# Patient Record
Sex: Female | Born: 1935 | Race: White | Hispanic: No | Marital: Married
Health system: Southern US, Community
[De-identification: ages and names within clinical notes are randomized; demographics above are authoritative.]

---

## 2003-12-15 ENCOUNTER — Other Ambulatory Visit: Payer: Self-pay

## 2007-01-05 ENCOUNTER — Emergency Department: Payer: Self-pay | Admitting: Emergency Medicine

## 2007-03-20 ENCOUNTER — Other Ambulatory Visit: Payer: Self-pay

## 2007-03-20 ENCOUNTER — Inpatient Hospital Stay: Payer: Self-pay | Admitting: Cardiovascular Disease

## 2007-03-21 ENCOUNTER — Other Ambulatory Visit: Payer: Self-pay

## 2008-02-26 ENCOUNTER — Other Ambulatory Visit: Payer: Self-pay

## 2008-02-26 ENCOUNTER — Ambulatory Visit: Payer: Self-pay | Admitting: Ophthalmology

## 2008-03-09 ENCOUNTER — Ambulatory Visit: Payer: Self-pay | Admitting: Ophthalmology

## 2008-05-30 ENCOUNTER — Emergency Department: Payer: Self-pay | Admitting: Emergency Medicine

## 2008-06-07 ENCOUNTER — Ambulatory Visit: Payer: Self-pay | Admitting: Ophthalmology

## 2010-01-13 ENCOUNTER — Ambulatory Visit: Payer: Self-pay | Admitting: Unknown Physician Specialty

## 2010-07-26 ENCOUNTER — Ambulatory Visit: Payer: Self-pay | Admitting: Cardiovascular Disease

## 2010-09-28 ENCOUNTER — Emergency Department: Payer: Self-pay | Admitting: Emergency Medicine

## 2010-11-08 ENCOUNTER — Ambulatory Visit: Payer: Self-pay | Admitting: Family Medicine

## 2011-01-19 ENCOUNTER — Ambulatory Visit: Payer: Self-pay | Admitting: Cardiovascular Disease

## 2011-09-28 ENCOUNTER — Emergency Department: Payer: Self-pay | Admitting: *Deleted

## 2011-09-28 LAB — CBC
HGB: 10.9 g/dL — ABNORMAL LOW (ref 12.0–16.0)
MCHC: 33.7 g/dL (ref 32.0–36.0)
MCV: 100 fL (ref 80–100)
Platelet: 70 10*3/uL — ABNORMAL LOW (ref 150–440)
RBC: 3.24 10*6/uL — ABNORMAL LOW (ref 3.80–5.20)
RDW: 16.2 % — ABNORMAL HIGH (ref 11.5–14.5)
WBC: 4.7 10*3/uL (ref 3.6–11.0)

## 2011-09-28 LAB — COMPREHENSIVE METABOLIC PANEL
Anion Gap: 6 — ABNORMAL LOW (ref 7–16)
BUN: 12 mg/dL (ref 7–18)
Bilirubin,Total: 0.8 mg/dL (ref 0.2–1.0)
Chloride: 105 mmol/L (ref 98–107)
Co2: 27 mmol/L (ref 21–32)
Creatinine: 0.91 mg/dL (ref 0.60–1.30)
EGFR (African American): >60
EGFR (Non-African Amer.): >60
Glucose: 125 mg/dL — ABNORMAL HIGH (ref 65–99)
Potassium: 3.8 mmol/L (ref 3.5–5.1)
SGOT(AST): 20 U/L (ref 15–37)
Total Protein: 6.5 g/dL (ref 6.4–8.2)

## 2011-09-28 LAB — TROPONIN I: Troponin-I: <0.02 ng/mL

## 2011-09-28 LAB — CK TOTAL AND CKMB (NOT AT ARMC): CK-MB: 0.7 ng/mL (ref 0.5–3.6)

## 2013-06-09 ENCOUNTER — Ambulatory Visit: Payer: Self-pay | Admitting: Family Medicine

## 2013-10-02 LAB — URINALYSIS, COMPLETE
Glucose,UR: NEGATIVE mg/dL (ref 0–75)
Nitrite: NEGATIVE
Ph: 6 (ref 4.5–8.0)
Protein: 100
Specific Gravity: 1.035 (ref 1.003–1.030)
Transitional Epi: 4

## 2013-10-02 LAB — SALICYLATE LEVEL

## 2013-10-02 LAB — COMPREHENSIVE METABOLIC PANEL
Albumin: 4.1 g/dL (ref 3.4–5.0)
Alkaline Phosphatase: 85 U/L
Anion Gap: 5 — ABNORMAL LOW (ref 7–16)
BUN: 11 mg/dL (ref 7–18)
Bilirubin,Total: 0.9 mg/dL (ref 0.2–1.0)
CO2: 29 mmol/L (ref 21–32)
CREATININE: 1.01 mg/dL (ref 0.60–1.30)
Calcium, Total: 8.4 mg/dL — ABNORMAL LOW (ref 8.5–10.1)
Chloride: 104 mmol/L (ref 98–107)
EGFR (African American): 58 — ABNORMAL LOW
GFR CALC NON AF AMER: 50 — AB
Glucose: 115 mg/dL — ABNORMAL HIGH (ref 65–99)
Osmolality: 276 (ref 275–301)
Potassium: 3.8 mmol/L (ref 3.5–5.1)
SGOT(AST): 19 U/L (ref 15–37)
SGPT (ALT): 17 U/L (ref 12–78)
Sodium: 138 mmol/L (ref 136–145)
Total Protein: 7.5 g/dL (ref 6.4–8.2)

## 2013-10-02 LAB — DRUG SCREEN, URINE
Amphetamines, Ur Screen: NEGATIVE (ref ?–1000)
BENZODIAZEPINE, UR SCRN: NEGATIVE (ref ?–200)
Barbiturates, Ur Screen: NEGATIVE (ref ?–200)
CANNABINOID 50 NG, UR ~~LOC~~: NEGATIVE (ref ?–50)
Cocaine Metabolite,Ur ~~LOC~~: NEGATIVE (ref ?–300)
MDMA (ECSTASY) UR SCREEN: NEGATIVE (ref ?–500)
Methadone, Ur Screen: NEGATIVE (ref ?–300)
Opiate, Ur Screen: NEGATIVE (ref ?–300)
Phencyclidine (PCP) Ur S: NEGATIVE (ref ?–25)
TRICYCLIC, UR SCREEN: NEGATIVE (ref ?–1000)

## 2013-10-02 LAB — ACETAMINOPHEN LEVEL: Acetaminophen: <2 ug/mL

## 2013-10-02 LAB — CBC
HCT: 39.7 % (ref 35.0–47.0)
HGB: 13.4 g/dL (ref 12.0–16.0)
MCH: 31.6 pg (ref 26.0–34.0)
MCHC: 33.7 g/dL (ref 32.0–36.0)
MCV: 94 fL (ref 80–100)
PLATELETS: 243 10*3/uL (ref 150–440)
RBC: 4.25 10*6/uL (ref 3.80–5.20)
RDW: 16 % — ABNORMAL HIGH (ref 11.5–14.5)
WBC: 15.1 10*3/uL — ABNORMAL HIGH (ref 3.6–11.0)

## 2013-10-02 LAB — ETHANOL
Ethanol %: <0.003 % (ref 0.000–0.080)
Ethanol: <3 mg/dL

## 2013-10-02 LAB — DIGOXIN LEVEL: Digoxin: 0.99 ng/mL

## 2013-10-03 LAB — CBC WITH DIFFERENTIAL/PLATELET
BASOS PCT: 1.5 %
Basophil #: 0.2 10*3/uL — ABNORMAL HIGH (ref 0.0–0.1)
EOS ABS: 0.2 10*3/uL (ref 0.0–0.7)
Eosinophil %: 1.6 %
HCT: 33.2 % — AB (ref 35.0–47.0)
HGB: 11.3 g/dL — AB (ref 12.0–16.0)
LYMPHS ABS: 1 10*3/uL (ref 1.0–3.6)
LYMPHS PCT: 8 %
MCH: 31.8 pg (ref 26.0–34.0)
MCHC: 34.1 g/dL (ref 32.0–36.0)
MCV: 93 fL (ref 80–100)
MONO ABS: 0.8 x10 3/mm (ref 0.2–0.9)
MONOS PCT: 6.5 %
NEUTROS PCT: 82.4 %
Neutrophil #: 10.3 10*3/uL — ABNORMAL HIGH (ref 1.4–6.5)
Platelet: 189 10*3/uL (ref 150–440)
RBC: 3.55 10*6/uL — AB (ref 3.80–5.20)
RDW: 16.2 % — ABNORMAL HIGH (ref 11.5–14.5)
WBC: 12.5 10*3/uL — ABNORMAL HIGH (ref 3.6–11.0)

## 2013-10-04 ENCOUNTER — Inpatient Hospital Stay: Payer: Self-pay | Admitting: Internal Medicine

## 2013-10-04 LAB — URINE CULTURE

## 2013-10-05 LAB — CBC WITH DIFFERENTIAL/PLATELET
Basophil #: 0.1 10*3/uL (ref 0.0–0.1)
Basophil %: 1.1 %
EOS PCT: 1.8 %
Eosinophil #: 0.2 10*3/uL (ref 0.0–0.7)
HCT: 33.1 % — AB (ref 35.0–47.0)
HGB: 11.2 g/dL — ABNORMAL LOW (ref 12.0–16.0)
LYMPHS ABS: 1.4 10*3/uL (ref 1.0–3.6)
Lymphocyte %: 12.1 %
MCH: 31.6 pg (ref 26.0–34.0)
MCHC: 33.7 g/dL (ref 32.0–36.0)
MCV: 94 fL (ref 80–100)
MONO ABS: 0.9 x10 3/mm (ref 0.2–0.9)
MONOS PCT: 7.4 %
NEUTROS ABS: 9.1 10*3/uL — AB (ref 1.4–6.5)
NEUTROS PCT: 77.6 %
PLATELETS: 170 10*3/uL (ref 150–440)
RBC: 3.53 10*6/uL — AB (ref 3.80–5.20)
RDW: 16.5 % — AB (ref 11.5–14.5)
WBC: 11.8 10*3/uL — AB (ref 3.6–11.0)

## 2013-10-05 LAB — BASIC METABOLIC PANEL
ANION GAP: 6 — AB (ref 7–16)
BUN: 9 mg/dL (ref 7–18)
CHLORIDE: 110 mmol/L — AB (ref 98–107)
CREATININE: 0.79 mg/dL (ref 0.60–1.30)
Calcium, Total: 7.9 mg/dL — ABNORMAL LOW (ref 8.5–10.1)
Co2: 27 mmol/L (ref 21–32)
EGFR (African American): >60
EGFR (Non-African Amer.): >60
GLUCOSE: 82 mg/dL (ref 65–99)
OSMOLALITY: 283 (ref 275–301)
Potassium: 3 mmol/L — ABNORMAL LOW (ref 3.5–5.1)
SODIUM: 143 mmol/L (ref 136–145)

## 2013-10-05 LAB — MAGNESIUM: Magnesium: 1.7 mg/dL — ABNORMAL LOW

## 2013-10-06 LAB — POTASSIUM: Potassium: 3 mmol/L — ABNORMAL LOW (ref 3.5–5.1)

## 2014-03-16 ENCOUNTER — Ambulatory Visit: Payer: Self-pay | Admitting: Internal Medicine

## 2014-03-16 LAB — CBC CANCER CENTER
Eosinophil: 1 %
HCT: 35.4 % (ref 35.0–47.0)
HGB: 11.8 g/dL — AB (ref 12.0–16.0)
LYMPHS PCT: 10 %
MCH: 32 pg (ref 26.0–34.0)
MCHC: 33.4 g/dL (ref 32.0–36.0)
MCV: 96 fL (ref 80–100)
Metamyelocyte: 1 %
Monocytes: 6 %
Platelet: 382 x10 3/mm (ref 150–440)
RBC: 3.69 10*6/uL — AB (ref 3.80–5.20)
RDW: 17.6 % — ABNORMAL HIGH (ref 11.5–14.5)
Segmented Neutrophils: 82 %
WBC: 28.4 x10 3/mm — ABNORMAL HIGH (ref 3.6–11.0)

## 2014-03-16 LAB — CREATININE, SERUM
CREATININE: 0.76 mg/dL (ref 0.60–1.30)
EGFR (Non-African Amer.): >60

## 2014-03-16 LAB — HEPATIC FUNCTION PANEL A (ARMC)
ALBUMIN: 3.5 g/dL (ref 3.4–5.0)
ALK PHOS: 104 U/L
ALT: 17 U/L
AST: 12 U/L — AB (ref 15–37)
Bilirubin, Direct: <0.1 mg/dL (ref 0.00–0.20)
Bilirubin,Total: 0.4 mg/dL (ref 0.2–1.0)
Total Protein: 7.4 g/dL (ref 6.4–8.2)

## 2014-04-13 ENCOUNTER — Ambulatory Visit: Payer: Self-pay | Admitting: Internal Medicine

## 2014-05-18 ENCOUNTER — Ambulatory Visit: Payer: Self-pay | Admitting: Internal Medicine

## 2014-05-18 LAB — COMPREHENSIVE METABOLIC PANEL
ALBUMIN: 3.4 g/dL (ref 3.4–5.0)
ALK PHOS: 112 U/L
ALT: 15 U/L
Anion Gap: 7 (ref 7–16)
BILIRUBIN TOTAL: 0.5 mg/dL (ref 0.2–1.0)
BUN: 12 mg/dL (ref 7–18)
Calcium, Total: 8.5 mg/dL (ref 8.5–10.1)
Chloride: 94 mmol/L — ABNORMAL LOW (ref 98–107)
Co2: 25 mmol/L (ref 21–32)
Creatinine: 0.92 mg/dL (ref 0.60–1.30)
EGFR (Non-African Amer.): >60
Glucose: 175 mg/dL — ABNORMAL HIGH (ref 65–99)
Osmolality: 257 (ref 275–301)
Potassium: 4.2 mmol/L (ref 3.5–5.1)
SGOT(AST): 14 U/L — ABNORMAL LOW (ref 15–37)
SODIUM: 126 mmol/L — AB (ref 136–145)
Total Protein: 6.5 g/dL (ref 6.4–8.2)

## 2014-05-18 LAB — CBC CANCER CENTER
BASOS PCT: 0.5 %
Basophil #: 0.2 x10 3/mm — ABNORMAL HIGH (ref 0.0–0.1)
EOS PCT: 1.4 %
Eosinophil #: 0.6 x10 3/mm (ref 0.0–0.7)
HCT: 36.4 % (ref 35.0–47.0)
HGB: 12.1 g/dL (ref 12.0–16.0)
LYMPHS ABS: 2.1 x10 3/mm (ref 1.0–3.6)
LYMPHS PCT: 4.7 %
MCH: 31.3 pg (ref 26.0–34.0)
MCHC: 33.2 g/dL (ref 32.0–36.0)
MCV: 94 fL (ref 80–100)
MONO ABS: 1.8 x10 3/mm — AB (ref 0.2–0.9)
MONOS PCT: 4.1 %
NEUTROS ABS: 39.1 x10 3/mm — AB (ref 1.4–6.5)
Neutrophil %: 89.3 %
PLATELETS: 704 x10 3/mm — AB (ref 150–440)
RBC: 3.86 10*6/uL (ref 3.80–5.20)
RDW: 17.8 % — ABNORMAL HIGH (ref 11.5–14.5)
WBC: 43.8 x10 3/mm — ABNORMAL HIGH (ref 3.6–11.0)

## 2014-05-18 LAB — PHOSPHORUS: Phosphorus: 2.9 mg/dL (ref 2.5–4.9)

## 2014-05-18 LAB — MAGNESIUM: Magnesium: 1.9 mg/dL

## 2014-05-25 LAB — BASIC METABOLIC PANEL
ANION GAP: 7 (ref 7–16)
BUN: 14 mg/dL (ref 7–18)
Calcium, Total: 8.4 mg/dL — ABNORMAL LOW (ref 8.5–10.1)
Chloride: 96 mmol/L — ABNORMAL LOW (ref 98–107)
Co2: 26 mmol/L (ref 21–32)
Creatinine: 0.99 mg/dL (ref 0.60–1.30)
EGFR (Non-African Amer.): 56 — ABNORMAL LOW
Glucose: 97 mg/dL (ref 65–99)
Osmolality: 259 (ref 275–301)
Potassium: 4.2 mmol/L (ref 3.5–5.1)
Sodium: 129 mmol/L — ABNORMAL LOW (ref 136–145)

## 2014-05-25 LAB — CBC CANCER CENTER
BASOS PCT: 0.9 %
Basophil #: 0.3 x10 3/mm — ABNORMAL HIGH (ref 0.0–0.1)
EOS PCT: 1.7 %
Eosinophil #: 0.6 x10 3/mm (ref 0.0–0.7)
HCT: 33.9 % — ABNORMAL LOW (ref 35.0–47.0)
HGB: 11.2 g/dL — ABNORMAL LOW (ref 12.0–16.0)
Lymphocyte #: 1.9 x10 3/mm (ref 1.0–3.6)
Lymphocyte %: 5.7 %
MCH: 31 pg (ref 26.0–34.0)
MCHC: 33 g/dL (ref 32.0–36.0)
MCV: 94 fL (ref 80–100)
Monocyte #: 1.8 x10 3/mm — ABNORMAL HIGH (ref 0.2–0.9)
Monocyte %: 5.5 %
NEUTROS PCT: 86.2 %
Neutrophil #: 29 x10 3/mm — ABNORMAL HIGH (ref 1.4–6.5)
Platelet: 477 x10 3/mm — ABNORMAL HIGH (ref 150–440)
RBC: 3.62 10*6/uL — ABNORMAL LOW (ref 3.80–5.20)
RDW: 17.6 % — ABNORMAL HIGH (ref 11.5–14.5)
WBC: 33.6 x10 3/mm — ABNORMAL HIGH (ref 3.6–11.0)

## 2014-06-08 LAB — CREATININE, SERUM
Creatinine: 0.97 mg/dL (ref 0.60–1.30)
GFR CALC NON AF AMER: 58 — AB

## 2014-06-08 LAB — CBC CANCER CENTER
BASOS ABS: 0.3 x10 3/mm — AB (ref 0.0–0.1)
BASOS PCT: 0.8 %
EOS ABS: 0.5 x10 3/mm (ref 0.0–0.7)
Eosinophil %: 1.5 %
HCT: 32.5 % — ABNORMAL LOW (ref 35.0–47.0)
HGB: 10.6 g/dL — ABNORMAL LOW (ref 12.0–16.0)
Lymphocyte #: 1.5 x10 3/mm (ref 1.0–3.6)
Lymphocyte %: 4.3 %
MCH: 31.2 pg (ref 26.0–34.0)
MCHC: 32.8 g/dL (ref 32.0–36.0)
MCV: 95 fL (ref 80–100)
MONO ABS: 1.4 x10 3/mm — AB (ref 0.2–0.9)
Monocyte %: 4.2 %
NEUTROS PCT: 89.2 %
Neutrophil #: 30.2 x10 3/mm — ABNORMAL HIGH (ref 1.4–6.5)
Platelet: 506 x10 3/mm — ABNORMAL HIGH (ref 150–440)
RBC: 3.42 10*6/uL — ABNORMAL LOW (ref 3.80–5.20)
RDW: 18 % — ABNORMAL HIGH (ref 11.5–14.5)
WBC: 33.8 x10 3/mm — ABNORMAL HIGH (ref 3.6–11.0)

## 2014-06-08 LAB — POTASSIUM: POTASSIUM: 4.1 mmol/L (ref 3.5–5.1)

## 2014-06-08 LAB — MAGNESIUM: MAGNESIUM: 1.9 mg/dL

## 2014-06-13 ENCOUNTER — Ambulatory Visit: Payer: Self-pay | Admitting: Internal Medicine

## 2014-12-01 ENCOUNTER — Inpatient Hospital Stay
Admit: 2014-12-01 | Disposition: A | Discharge status: Hospice | Payer: Self-pay | Attending: Internal Medicine | Admitting: Internal Medicine

## 2014-12-01 LAB — BASIC METABOLIC PANEL
ANION GAP: 9 (ref 7–16)
BUN: 14 mg/dL
Calcium, Total: 8.4 mg/dL — ABNORMAL LOW
Chloride: 101 mmol/L
Co2: 27 mmol/L
Creatinine: 1.02 mg/dL — ABNORMAL HIGH
EGFR (African American): 57 — ABNORMAL LOW
EGFR (Non-African Amer.): 49 — ABNORMAL LOW
Glucose: 101 mg/dL — ABNORMAL HIGH
Potassium: 3.4 mmol/L — ABNORMAL LOW
SODIUM: 137 mmol/L

## 2014-12-01 LAB — CBC WITH DIFFERENTIAL/PLATELET
Bands: 1 %
COMMENT - H1-COM3: NORMAL
EOS PCT: 2 %
HCT: 40.2 % (ref 35.0–47.0)
HGB: 12.9 g/dL (ref 12.0–16.0)
LYMPHS PCT: 6 %
MCH: 30.4 pg (ref 26.0–34.0)
MCHC: 32 g/dL (ref 32.0–36.0)
MCV: 95 fL (ref 80–100)
METAMYELOCYTE: 1 %
Monocytes: 4 %
Platelet: 266 10*3/uL (ref 150–440)
RBC: 4.24 10*6/uL (ref 3.80–5.20)
RDW: 19.9 % — AB (ref 11.5–14.5)
Segmented Neutrophils: 86 %
WBC: 71.5 10*3/uL — ABNORMAL HIGH (ref 3.6–11.0)

## 2014-12-01 LAB — URINALYSIS, COMPLETE
BACTERIA: NONE SEEN
Bilirubin,UR: NEGATIVE
Glucose,UR: NEGATIVE mg/dL (ref 0–75)
Ketone: NEGATIVE
Leukocyte Esterase: NEGATIVE
Nitrite: NEGATIVE
PROTEIN: NEGATIVE
Ph: 5 (ref 4.5–8.0)
SPECIFIC GRAVITY: 1.006 (ref 1.003–1.030)
WBC UR: NONE SEEN /HPF (ref 0–5)

## 2014-12-01 LAB — TROPONIN I: Troponin-I: 0.2 ng/mL — ABNORMAL HIGH

## 2014-12-02 LAB — TROPONIN I
TROPONIN-I: 0.24 ng/mL — AB
Troponin-I: 0.19 ng/mL — ABNORMAL HIGH

## 2014-12-02 LAB — CK-MB
CK-MB: 2.5 ng/mL
CK-MB: 1.8 ng/mL

## 2014-12-04 LAB — PLATELET COUNT: Platelet: 228 10*3/uL (ref 150–440)

## 2014-12-04 NOTE — H&P (Signed)
PATIENT NAME:  Melissa Hughes, Melissa Hughes MR#:  [id number] DATE OF BIRTH:  Dec 28, 1924  DATE OF ADMISSION:  10/02/2013  PRIMARY CARE PHYSICIAN: Dr. Dossie Arbour.   CHIEF COMPLAINT: Hallucinations.   This is an 81 year old female with a history panic attacks, possible heart disease, who presents with the above complaint. Over the past 2 weeks according to the son, the patient has had increasing hallucinations and confusion, which is unusual for the patient. She does have some baseline confusion as far as time and date, but not the hallucinations. She has been wandering around for the past 2 weeks as well. Son brought her here for further evaluation. In the ER, she was noted to have a urinary tract infection. She was started on antibiotics.   REVIEW OF SYSTEMS:    CONSTITUTIONAL: No fever, fatigue, weakness.  EYES: No blurred or double vision. EARS, NOSE, THROAT: No ear pain, hearing loss, snoring. RESPIRATORY: No cough, wheezing, hemoptysis, COPD. CARDIOVASCULAR: No chest pain, orthopnea, edema, arrhythmia, dyspnea on exertion, palpitations, syncope. GASTROINTESTINAL: No nausea, vomiting, diarrhea, abdominal pain, melena or ulcers. GENITOURINARY: No dysuria or hematuria. ENDOCRINE: No polyuria or polydipsia.  HEMATOLOGIC AND LYMPHATIC: No anemia or easy bruising.  SKIN: No rash or lesions.  MUSCULOSKELETAL: No limited activity. No falls. NEUROLOGIC: No history of CVA or TIA. PSYCHIATRIC: Positive anxiety, panic attacks.   PAST MEDICAL HISTORY:  1.  GERD. 2.  Panic attacks.  3.  Possible heart disease.  4.  Hypertension.  MEDICATIONS: 1. Plavix 75 mg daily. 2.  Metoprolol 200 mg daily.  3.  Losartan 100 mg daily. 4.  Digoxin 0.125 mg daily. 5.  Xanax 0.5 mg t.i.d.   ALLERGIES: MERTHIOLATE.   PAST SURGICAL HISTORY: None.   SOCIAL HISTORY: No tobacco, alcohol, or drug use. She lives by herself. Her family is looking into [facility] independent/assisted living.   PHYSICAL EXAMINATION: VITAL  SIGNS: Temperature 97.8, pulse 77, respirations 18, blood pressure 149/71, 97% on room air.  GENERAL: The patient is alert, oriented to name and place. She is just not oriented to date. She does not appear to be confused. However, after speaking with the nurse, the nurse does state that she has been having some hallucinations. This is obviously not demonstrated during my examination.  HEENT: Head is atraumatic. Pupils are round and reactive. Sclerae anicteric. Mucous membranes are moist. Oropharynx is clear.  NECK: Supple without JVD, carotid bruit, or enlarged thyroid.  CARDIOVASCULAR: Regular rate and rhythm. No murmurs, gallops, or rubs. PMI is not displaced.  LUNGS: Clear to auscultation without crackles, rales, rhonchi, or wheezing. Normal to percussion.  ABDOMEN: Bowel sounds are positive. Nontender, nondistended. No hepatosplenomegaly. EXTREMITIES: No clubbing, cyanosis, or edema.  NEUROLOGIC: Cranial nerves II through XII are intact. No focal deficits. SKIN: Without rashes or lesions. NEUROLOGIC: Cranial nerves II through XII are grossly intact. There are no focal deficits.  LABORATORY DATA: Urine toxicology is negative. Urinalysis shows 3+ LCE with 1484 white blood cells.   Sodium 133, potassium 3.8, chloride 104, bicarbonate 29, BUN 11, creatinine 1.01, glucose 115. White blood cells 15, hemoglobin 13.4, hematocrit 40; platelets are 243. Alcohol less than 3. Tylenol level less than 2. Aspirin level less than 1.7. Digoxin 0.99.   ASSESSMENT AND PLAN: An 81 year old female who presents with hallucinations, found to have a   urinary tract infection. 1.  Hallucinations, likely secondary to urinary tract infection.  2.  Urinary tract infection. Will treat with Rocephin. Urine culture has been ordered. Continue to monitor her symptoms.  3.  Panic attacks. The patient will continue on her outpatient medications.  4.  Hypertension. The patient will continue her losartan.  5.  Heart disease. The  patient will continue on digoxin and Plavix. 6.  The patient is a full code status. This was confirmed by her son.   TIME SPENT: Approximately 40 minutes.   ____________________________ Janyth ContesSital P. Juliene PinaMody, MD spm:jcm D: 10/02/2013 15:44:04 ET T: 10/02/2013 16:53:48 ET JOB#: [id number]  cc: Mercadez Heitman P. Juliene PinaMody, MD, <Dictator> Steele SizerMark A. Crissman, MD Janyth ContesSITAL P Hanh Kertesz MD ELECTRONICALLY SIGNED 10/02/2013 17:48

## 2014-12-04 NOTE — Discharge Summary (Signed)
PATIENT NAME:  Melissa Hughes, Melissa Hughes MR#:  [id number] DATE OF BIRTH:  07-18-1925  DATE OF ADMISSION:  10/04/2013 DATE OF DISCHARGE:  10/06/2013  DISCHARGE DIAGNOSES: 1.  Acute metabolic encephalopathy, now resolved and back to baseline likely due to urinary tract infection with underlying dementia.  2.  Escherichia coli urinary tract infection, improving. 3.  Hypertension, uncontrolled and now improving. 4.  Hypokalemia, repleted and resolved.   SECONDARY DIAGNOSES: 1.  Gastroesophageal reflux disease.  2.  Panic attacks.  3.  Hypertension.   CONSULTATION: Physical therapy.   PROCEDURES AND RADIOLOGY: CT scan of the head without contrast on the 21st of February showed no acute abnormality.   MAJOR LABORATORY PANEL: UA on admission showed 2+ bacteria, 1484 WBCs, 3+ leukocyte esterase, WBC in clumps present.  Urine culture grew more than 100,000 colonies of Escherichia coli.  Serum Tylenol level than 2. Salicylate level less than 1.7.   HISTORY AND SHORT HOSPITAL COURSE: The patient is an 81 year old female with above-mentioned medical problems was admitted for hallucinations. Please see Dr. Camillia HerterMody's dictated history and physical for further details. This was thought to be secondary to UTI. The patient was growing Escherichia coli in the urine which was treated with antibiotic. Her oral metabolic encephalopathy was close to baseline on the 24th of February with treatment of UTI. The patient was also found to have low potassium, which was repleted and resolved, and blood pressure was running high which was also improving with adjustment in the medication. On the 24th of February, she was back to her baseline mental status and was not having any hallucinations. [facility].   PHYSICAL EXAMINATION: VITAL SIGNS: On the date of discharge, her vital signs were as follows: Temperature 98, heart rate 69 per minute, respirations 18 per minute, blood pressure 140/69 mmHg.  She was saturating 95% on room air.   PERTINENT PHYSICAL EXAMINATION ON THE DATE OF DISCHARGE:  CARDIOVASCULAR: S1, S2 normal. No murmurs, rubs or gallops.  LUNGS: Clear to auscultation bilaterally. No wheezing, rales, rhonchi or crepitation.  ABDOMEN: Soft, benign.  NEUROLOGIC: Nonfocal examination.  All other physical examination remained at the baseline.   DISCHARGE MEDICATIONS: 1.  Alprazolam 0.5 mg p.o. b.i.Hughes. 2.  Digoxin 125 mcg p.o. daily.  3.  Metoprolol 200 mg p.o. daily.  4.  Plavix 75 mg p.o. daily.  5.  Losartan 100 mg p.o. daily.  6.  Isosorbide mononitrate 60 mg p.o. daily.  7.  Levaquin 250 mg p.o. daily for 2 more days.   DISCHARGE DIET: Low sodium.  DISCHARGE ACTIVITY: As tolerated.  DISCHARGE INSTRUCTIONS AND FOLLOWUP: The patient was instructed to follow up with her primary care physician, Dr. Vonita MossMark Crissman, in 1 to 2 weeks. She will get physical at home by home health services.   TOTAL TIME DISCHARGING THIS PATIENT: 45 minutes.   ____________________________ Ellamae SiaVipul S. Sherryll BurgerShah, MD vss:ce Hughes: 10/10/2013 16:41:27 ET T: 10/10/2013 18:01:36 ET JOB#: [id number]  cc: Terie Lear S. Sherryll BurgerShah, MD, <Dictator> Steele SizerMark A. Crissman, MD [facility] Ellamae SiaVIPUL S [facility] MD ELECTRONICALLY SIGNED 10/12/2013 13:03

## 2014-12-05 LAB — BASIC METABOLIC PANEL
ANION GAP: 2 — AB (ref 7–16)
BUN: 10 mg/dL
CALCIUM: 7.7 mg/dL — AB
Chloride: 106 mmol/L
Co2: 32 mmol/L
Creatinine: 0.66 mg/dL
EGFR (African American): >60
EGFR (Non-African Amer.): >60
GLUCOSE: 72 mg/dL
POTASSIUM: 2.9 mmol/L — AB
Sodium: 140 mmol/L

## 2014-12-05 LAB — CBC WITH DIFFERENTIAL/PLATELET
Basophil #: 0.2 10*3/uL — ABNORMAL HIGH (ref 0.0–0.1)
Basophil %: 0.4 %
EOS ABS: 0.5 10*3/uL (ref 0.0–0.7)
Eosinophil %: 1.1 %
HCT: 35 % (ref 35.0–47.0)
HGB: 11 g/dL — AB (ref 12.0–16.0)
LYMPHS ABS: 1.9 10*3/uL (ref 1.0–3.6)
Lymphocyte %: 4.7 %
MCH: 30.1 pg (ref 26.0–34.0)
MCHC: 31.5 g/dL — AB (ref 32.0–36.0)
MCV: 95 fL (ref 80–100)
MONO ABS: 1.5 x10 3/mm — AB (ref 0.2–0.9)
Monocyte %: 3.7 %
NEUTROS ABS: 36.9 10*3/uL — AB (ref 1.4–6.5)
NEUTROS PCT: 90.1 %
Platelet: 208 10*3/uL (ref 150–440)
RBC: 3.67 10*6/uL — ABNORMAL LOW (ref 3.80–5.20)
RDW: 19.7 % — ABNORMAL HIGH (ref 11.5–14.5)
WBC: 41 10*3/uL — AB (ref 3.6–11.0)

## 2014-12-06 LAB — CBC WITH DIFFERENTIAL/PLATELET
BASOS ABS: 0.3 10*3/uL — AB (ref 0.0–0.1)
Basophil %: 0.6 %
EOS PCT: 1.3 %
Eosinophil #: 0.6 10*3/uL (ref 0.0–0.7)
HCT: 36.5 % (ref 35.0–47.0)
HGB: 11.7 g/dL — ABNORMAL LOW (ref 12.0–16.0)
Lymphocyte #: 1.8 10*3/uL (ref 1.0–3.6)
Lymphocyte %: 3.9 %
MCH: 30.9 pg (ref 26.0–34.0)
MCHC: 32.2 g/dL (ref 32.0–36.0)
MCV: 96 fL (ref 80–100)
MONOS PCT: 2.9 %
Monocyte #: 1.3 x10 3/mm — ABNORMAL HIGH (ref 0.2–0.9)
NEUTROS ABS: 41.7 10*3/uL — AB (ref 1.4–6.5)
Neutrophil %: 91.3 %
Platelet: 250 10*3/uL (ref 150–440)
RBC: 3.79 10*6/uL — AB (ref 3.80–5.20)
RDW: 19.8 % — ABNORMAL HIGH (ref 11.5–14.5)
WBC: 45.7 10*3/uL — AB (ref 3.6–11.0)

## 2014-12-06 LAB — BASIC METABOLIC PANEL
Anion Gap: 4 — ABNORMAL LOW (ref 7–16)
BUN: 9 mg/dL
CHLORIDE: 105 mmol/L
CREATININE: 0.68 mg/dL
Calcium, Total: 8.1 mg/dL — ABNORMAL LOW
Co2: 31 mmol/L
EGFR (African American): >60
Glucose: 71 mg/dL
Potassium: 4.7 mmol/L
SODIUM: 140 mmol/L

## 2014-12-12 NOTE — H&P (Signed)
PATIENT NAME:  Melissa Hughes, Melissa Hughes MR#:  [id number] DATE OF BIRTH:  1925/07/08  DATE OF ADMISSION:  12/01/2014  PRIMARY CARE PHYSICIAN:  Dr. Vonita MossMark Crissman.   CHIEF COMPLAINT:  Agitation and not her usual self.   HISTORY OF PRESENT ILLNESS:  History is obtained from the ER physician, the patient's son-in-law, and old records.  [name] is an 81 year old Caucasian female with past medical history of chronic myelogenous leukemia, who is off chemotherapy since last year and is [facility], comes to the Emergency Room after son started noticing the patient has been confused for past 1 week and since yesterday has been agitated, shouting, screaming, and not her usual self. She has not eaten in last couple days and has been refusing to take anything orally. In the Emergency Room she is hemodynamically stable, however initially she had normal sinus rhythm, became very tachycardic during my evaluation with heart rate in the 130s. It was felt clinically she was dehydrated from not eating and drinking well. Her urine looks clear and chest x-ray no pneumonia. She is being admitted for acute delirium likely secondary to dehydration. No other etiology is known as of yet.   PAST MEDICAL HISTORY:  1.  Chronic myelogenous leukemia, was on chemotherapy, discontinued since last year since the patient continued to show poor response to chemotherapy.  2.  Hypertension.  3.  Osteoporosis.  4.  CAD.   5.  History of SVT.   6.  History of CHF with fluid retention.   FAMILY HISTORY: From old records, daughter with history of breast cancer.   SOCIAL HISTORY: [facility]. Nonsmoker, nonalcoholic given, and she uses a walker to walk. [facility].   ALLERGIES: TO MERTHIOLATE.    MEDICATIONS:   1. Alprazolam 0.5 mg b.i.Hughes.  2. Aspirin 81 mg daily.  3. Carvedilol 12.5 mg half a tablet b.i.Hughes.  4. Digoxin 125 p.o. daily.  5. Entresto 49/51 half a tablet  b.i.Hughes.  6. Furosemide 20 mg p.o. daily.  7. Omeprazole 40 mg daily.   REVIEW OF SYSTEMS: Unobtainable.   CODE STATUS: No code, DNR.  This is confirmed with the patient's son-in-law.   PHYSICAL EXAMINATION:  GENERAL: She is lethargic. She awakens upon verbal commands, says her name, the rest she is disoriented to time, place, and person.  VITAL SIGNS:  She is afebrile. Pulse is 138, tachycardic, blood pressure is 83/58, saturations are 94% on room air.  HEENT: Atraumatic, normocephalic. Pupils PERRLA.  EOMI intact. Oral mucosa is dry. NECK: Supple. No JVD. No carotid bruit.  RESPIRATORY: Decreased breath sounds at the bases. No respiratory distress or labored breathing.  CARDIOVASCULAR: Tachycardia present. No murmur heard. PMI not lateralized. Chest nontender.   EXTREMITIES:  Dependent pitting edema 3 + both lower extremities.  SKIN: Warm and dry.   NEUROLOGIC:  Unable to assess secondary to patient's lethargy.  PSYCHIATRIC: Unable to assess, the patient is lethargic.   LABORATORY DATA:   1.  UA negative for UTI.  2.  CT head, no acute intracranial abnormality, chronic atrophy, small vessel ischemic disease. 3.  White count is 71.5. Glucose is 101, BUN is 14, creatinine is 1.02, sodium 137, potassium 3.4, chloride is 101, bicarbonate is 27. Troponin is 0.20.   ASSESSMENT: An 81 year old, [place] Hughes, with history of chronic myelogenous leukemia who is status post chemotherapy, now off chemotherapy since last year October of 2015 since not responsive to treatment, history of  coronary artery disease, hypertension, comes into the Emergency Room with:   1.  Altered mental status/acute encephalopathy/acute delirium with agitation, screaming at [facility]. She appears to be clinically dry, has not eaten or drank in the last 3 days. The patient became tachycardic with hypotension, blood pressure in the systolic in the 80s in the Emergency Room. She is currently getting bolus IV fluid. We will  monitor Is and Os. CT head is essentially negative. We will continue to monitor her mental status, keep her n.p.o. at present.  2.  Acute renal failure secondary to dehydration. Continue IV fluids, monitor Is and Os, avoid nephrotoxins.  3.  Hypotension, secondary suspected due to dehydration.  4.  Tachycardia, suspected again due to dehydration. Monitor her blood pressure. Once blood pressure is stable with IV fluids then resume her p.o. blood pressure medications.  5.  History of chronic myelogenous leukemia off chemotherapy since not responding to treatment. The patient is under care of hospice at [facility].  6.  Code status: No code, DNR.  This was discussed with the patient's son-in-law. The patient's son-in-law understands the poor prognosis. He is agreeable to transition to hospice facility in case the patient does not show signs of improvement.  7.  Deep vein thrombosis prophylaxis. Subcutaneous heparin t.i.Hughes.   TIME SPENT: 50 minutes.     ____________________________ Wylie Hail Allena Katz, MD sap:bu Hughes: 12/01/2014 15:34:32 ET T: 12/01/2014 15:47:24 ET JOB#: [id number]  cc: Ryla Cauthon A. Allena Katz, MD, <Dictator> Steele Sizer, MD Willow Ora MD ELECTRONICALLY SIGNED 12/10/2014 15:21

## 2014-12-12 DEATH — deceased

## 2014-12-13 NOTE — Discharge Summary (Signed)
PATIENT NAME:  Melissa Hughes, Melissa Hughes MR#:  [id number] DATE OF BIRTH:  1924/09/02  DATE OF ADMISSION:  12/01/2014 DATE OF DISCHARGE:  12/06/2014  PRESENTING COMPLAINT: Altered mental status.   DISCHARGE DIAGNOSES: 1. Acute metabolic encephalopathy.  2. Dementia.  3. Hospital-acquired pneumonia.  4. Hypertension.  5. Hypokalemia.  6. Coronary artery disease.  7. Chronic myelogenous leukemia.     CONSULTATIONS: None.   PROCEDURES: 1. CT scan of the head without contrast shows no acute intracranial abnormality,  chronic atrophy and slight small vessel ischemic disease.  2. Chest x-ray April 20 shows low lung volumes, prominent heart size, atherosclerotic calcification of the aorta, hiatal hernia, bibasilar atelectasis, and central bronchitic changes. No gross infiltrate or pleural effusion.  3. Chest x-ray April 21 shows increasing bibasilar atelectasis versus infiltrate.  4. A 2D echocardiogram April 21 shows left ventricular ejection fraction 60-65%. Normal left ventricular systolic function. Mild mitral valve regurgitation, mild to moderate aortic valve sclerosis/calcification without any evidence of aortic stenosis. Moderate tricuspid regurgitation.  Mildly elevated pulmonary artery systolic pressures.   HISTORY OF PRESENT ILLNESS: This 81 year old Caucasian female with past medical history of chronic myelogenous leukemia off of chemotherapy since 1 year prior to admission [facility], comes to the Emergency Room after her son noticed that she was confused for 1 week, agitated, shouting, screaming and not her usual self. She has also not been eating. She was tachycardic on admission to the 130s.   HOSPITAL COURSE BY PROBLEM:  1. Altered mental status likely due to dehydration as well as healthcare associated pneumonia in the setting of underlying dementia: [name]'s mental status cleared somewhat during the hospitalization and she did not have any further  agitation.  Family visited on multiple occasions and felt that she was close to her baseline at the time of discharge after hydration and treatment of her pneumonia.  2. Healthcare associated pneumonia as seen on chest x-ray April 21.  She was treated on vancomycin and Levaquin during the hospitalization and was discharged with continued Levaquin.  3. Hypertension: Blood pressure was controlled throughout the hospitalization.  4. CML:   On admission, she was dehydrated and blood was concentrated showing a white blood cell count in the 70,000s and on discharge after volume repletion,  it is down to 41,000. This may also be contributing to her metabolic encephalopathy.  5. Coronary artery disease: She did not have any chest pain or EKG changes.  She had  mildly elevated troponins likely due to dehydration.  Her 2D echocardiogram shows an intact ejection fraction with no focal wall motion abnormalities.   DISCHARGE PHYSICAL EXAMINATION: VITAL SIGNS: Temperature 97.7, pulse 81, respirations 16, blood pressure 148/76, oxygenation 93% at rest on 2 liters via nasal cannula.  GENERAL: No acute distress, though she seems fairly fatigued.  CARDIOVASCULAR: Regular rate and rhythm. There is a 3 out of 6 systolic ejection murmur. No peripheral edema. Peripheral pulses 1+.  RESPIRATORY: Lungs are clear to auscultation bilaterally with good air movement though she does take fairly shallow respirations.  PSYCHIATRIC: She is fairly sleepy, though she does arouse to voice.   She is not oriented to date or place. She is oriented to person and family only.   LABORATORY DATA: Sodium 140, potassium 4.7, chloride 105, bicarbonate 31, BUN 9, creatinine 0.68, troponins were 0.24, decreasing to 0.19. White blood cells 45.7, hemoglobin 11.7, platelets 250,000. MCV is 96.  Urinalysis is negative for signs of infection.  DISCHARGE MEDICATIONS: 1. Alprazolam 0.5 mg 1 tablet twice a day.  2. Omeprazole 40 mg 1 tablet daily.   3. Digoxin 125 mcg 1 tablet daily.  4. Aspirin 81 mg 1 tablet daily.  5. Carvedilol 12.5 mg 0.5 tablets twice a day.  6. Ensure Enlive 237 mL 3 times a day with meals.  7. Levaquin 750 mg 1 tablet every 48 hours, stop on May 30.   CONDITION ON DISCHARGE: Guarded.   DISPOSITION: She is being discharged back to her assisted living facility with hospice following.  No further home health needs.   DIET: Low-sodium.   ACTIVITY: As tolerated.   TIMEFRAME FOR FOLLOW-UP:  In 1-2 days with hospice attending.   TIME SPENT ON DISCHARGE: 40 minutes.    ____________________________ Ena Dawley. Clent Ridges, MD cpw:tr Hughes: 12/11/2014 13:12:40 ET T: 12/11/2014 13:36:10 ET JOB#: [id number]  cc: Santina Evans P. Clent Ridges, MD, <Dictator> Gale Journey MD ELECTRONICALLY SIGNED 12/11/2014 14:24

## 2015-10-25 IMAGING — CT CT HEAD WITHOUT CONTRAST
1 series · 16 of 30 positions shown, 20 images · non-contrast
Comparison: None.

CLINICAL DATA: Altered mental status, confusion

EXAM:
CT HEAD WITHOUT CONTRAST
TECHNIQUE: Contiguous axial images were obtained from the base of the skull
through the vertex without intravenous contrast.

[Series 2: soft tissue · axial · 0.47mm/px · z∈[-86,+54]mm · 16 of 32 slices shown, 20 images]
[im 2/32  brain]
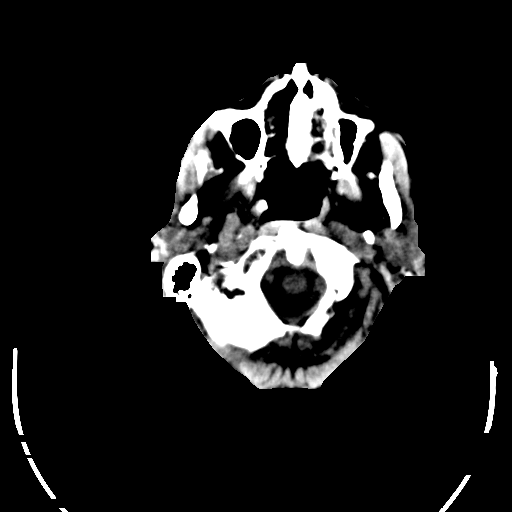
[im 2/32  bone]
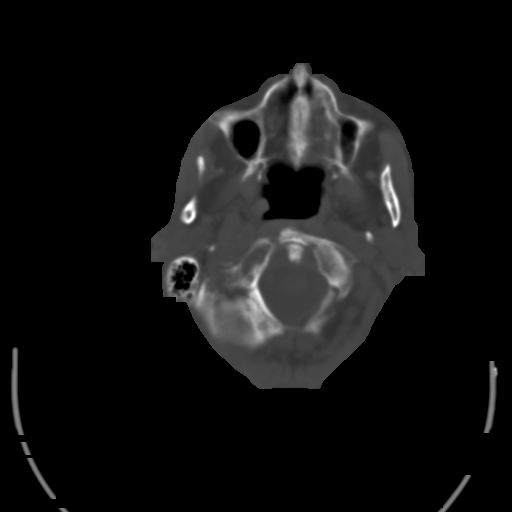
[im 4/32  brain]
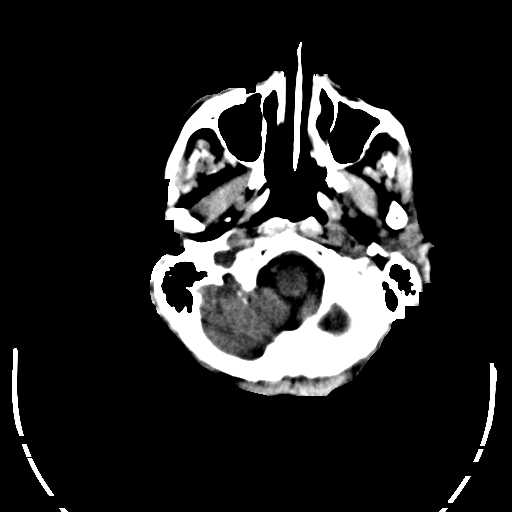
[im 6/32  brain]
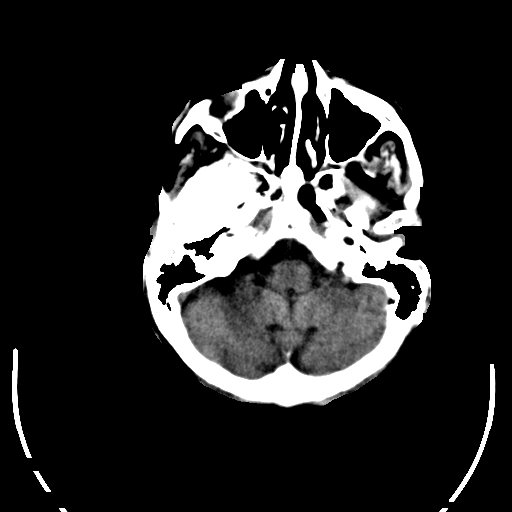
[im 8/32  brain]
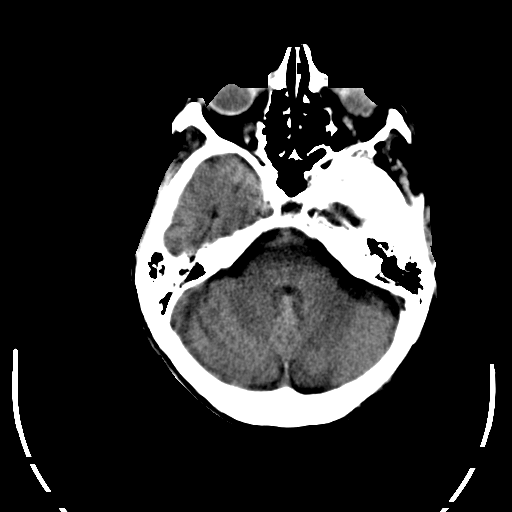
[im 9/32  brain]
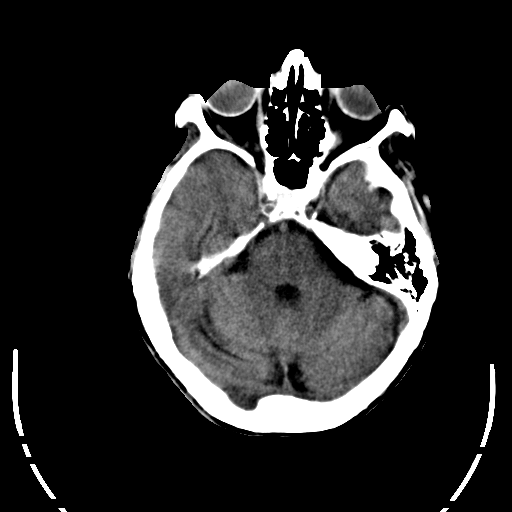
[im 9/32  bone]
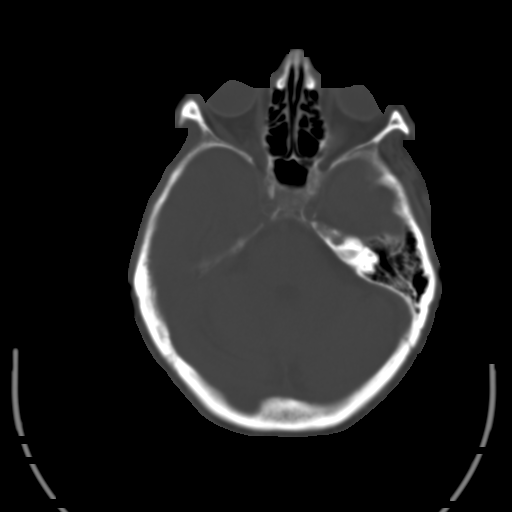
[im 11/32  brain]
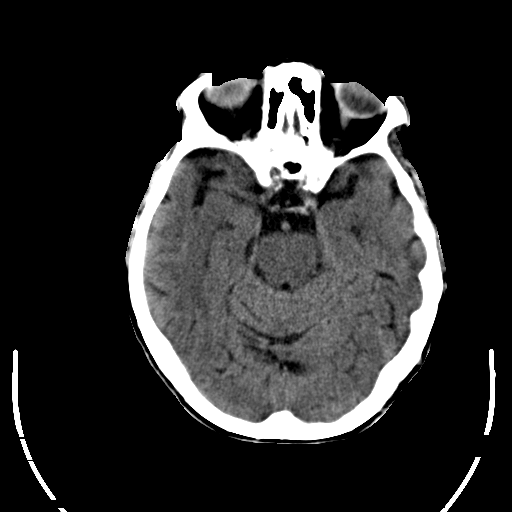
[im 13/32  brain]
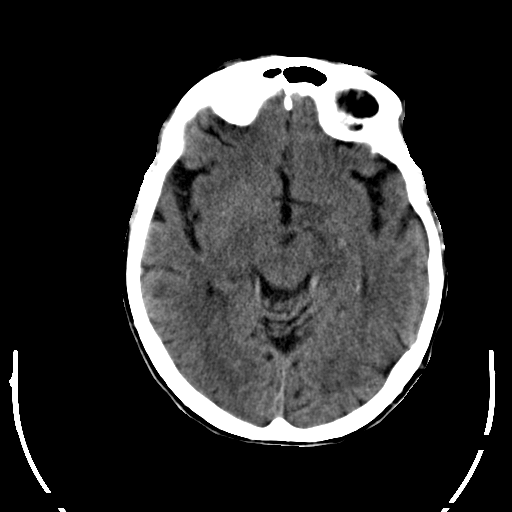
[im 15/32  brain]
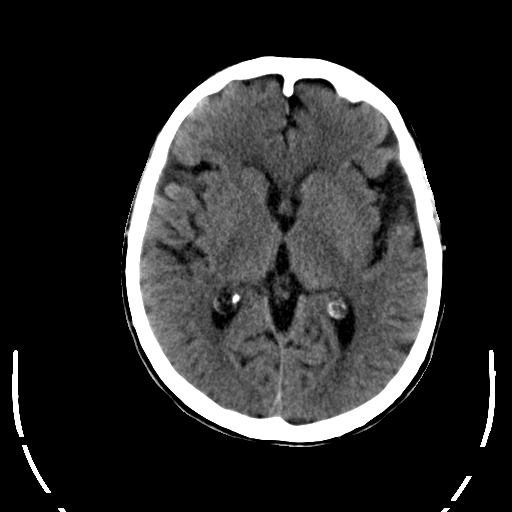
[im 17/32  brain]
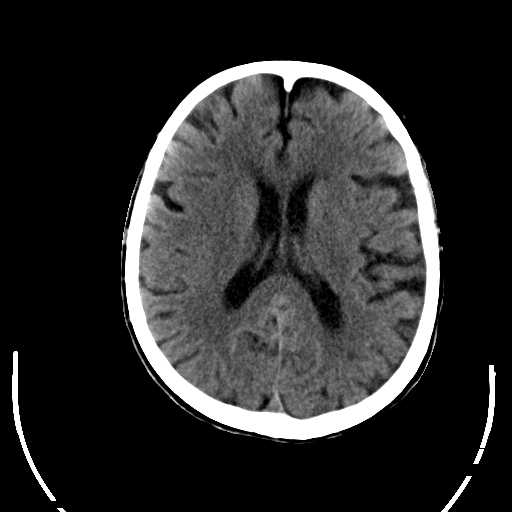
[im 17/32  bone]
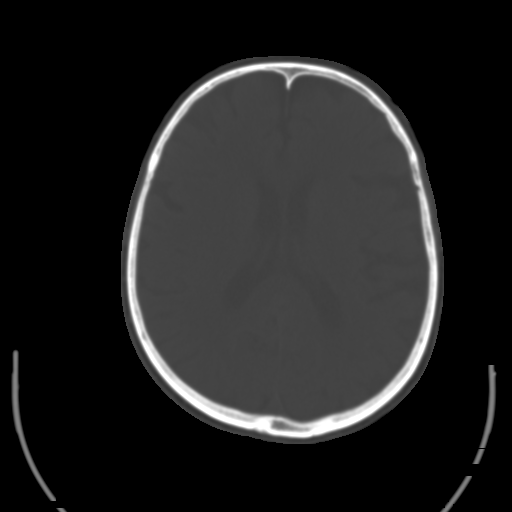
[im 19/32  brain]
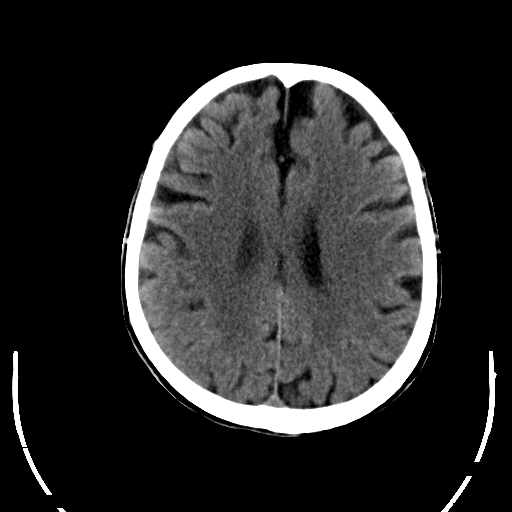
[im 21/32  brain]
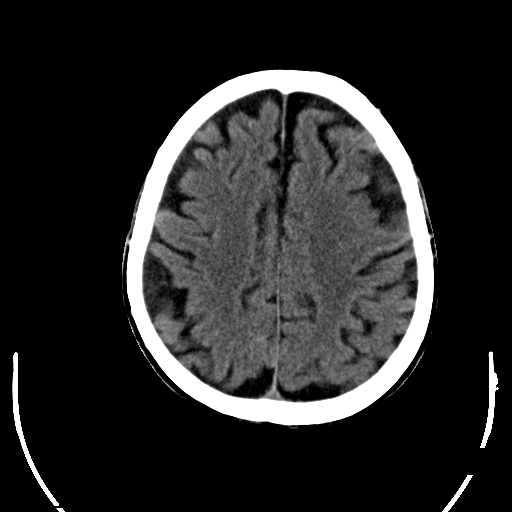
[im 23/32  brain]
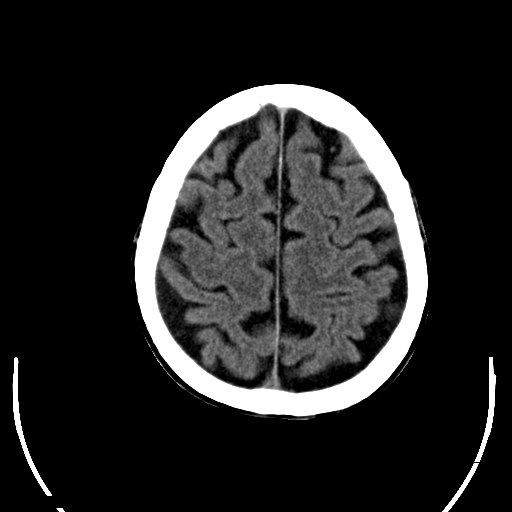
[im 24/32  brain]
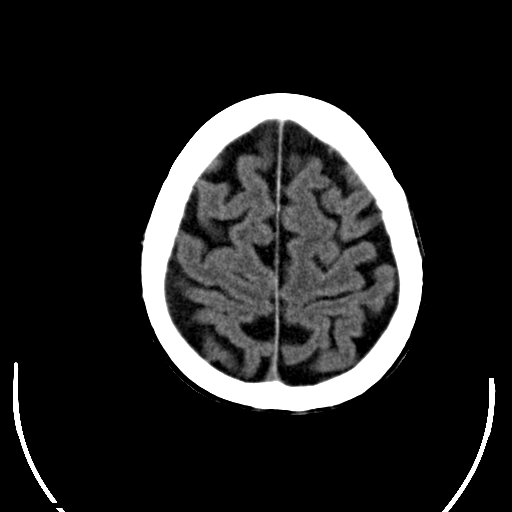
[im 24/32  bone]
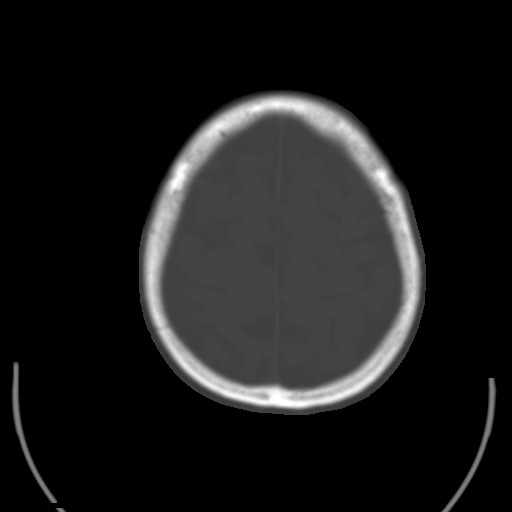
[im 26/32  brain]
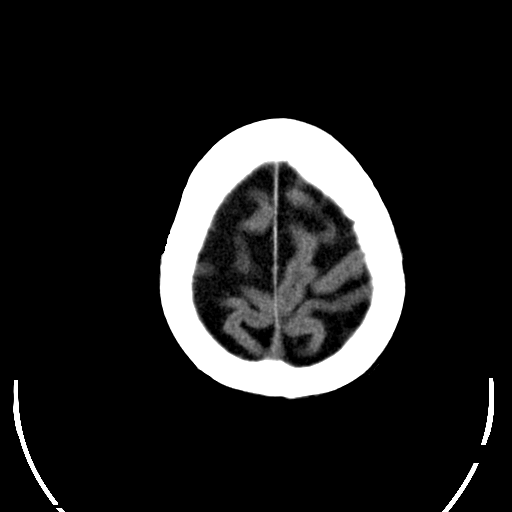
[im 28/32  brain]
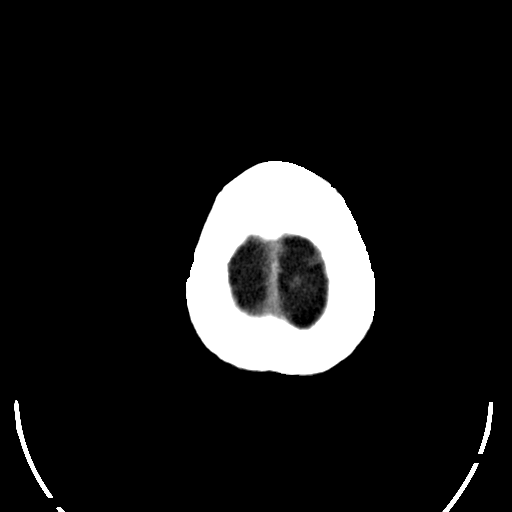
[im 30/32  brain]
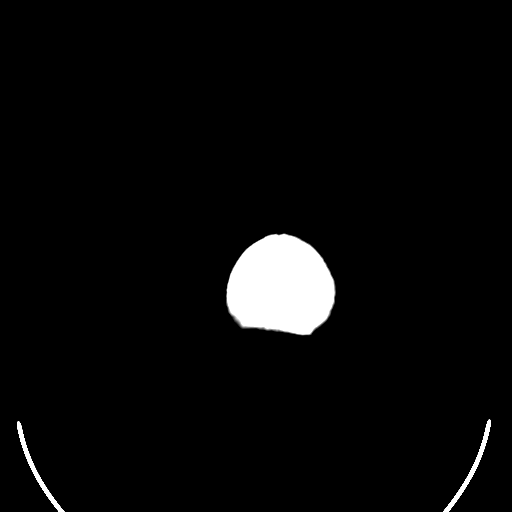

[16 of 30 positions shown; findings below may reference images not displayed]

FINDINGS: Mild generalized atrophy. Ventricle size within normal limits space
at ventricle size consistent with the level of atrophy.

Mild chronic microvascular ischemic change in the white matter and
internal capsule bilaterally. No acute infarct, hemorrhage, mass. No
focal skull lesion identified.
IMPRESSION: Atrophy and mild chronic microvascular ischemia. No acute
abnormality.
